# Patient Record
Sex: Female | Born: 1965 | Race: Asian | Hispanic: No
Health system: Southern US, Community
[De-identification: ages and names within clinical notes are randomized; demographics above are authoritative.]

---

## 2007-06-04 ENCOUNTER — Ambulatory Visit: Payer: Self-pay | Admitting: Internal Medicine

## 2007-06-04 ENCOUNTER — Ambulatory Visit (HOSPITAL_COMMUNITY): Admission: RE | Admit: 2007-06-04 | Discharge: 2007-06-04 | Payer: Self-pay | Admitting: Family Medicine

## 2007-06-19 ENCOUNTER — Ambulatory Visit: Payer: Self-pay | Admitting: *Deleted

## 2007-06-19 ENCOUNTER — Ambulatory Visit: Payer: Self-pay | Admitting: Internal Medicine

## 2007-06-19 LAB — CONVERTED CEMR LAB
ALT: 13 units/L (ref 0–35)
Alkaline Phosphatase: 50 units/L (ref 39–117)
Basophils Absolute: 0 10*3/uL (ref 0.0–0.1)
Creatinine, Ser: 0.64 mg/dL (ref 0.40–1.20)
Eosinophils Absolute: 0.2 10*3/uL (ref 0.0–0.7)
Eosinophils Relative: 4 % (ref 0–5)
HCT: 37 % (ref 36.0–46.0)
Hemoglobin: 11.9 g/dL — ABNORMAL LOW (ref 12.0–15.0)
MCHC: 32.2 g/dL (ref 30.0–36.0)
MCV: 91.6 fL (ref 78.0–100.0)
Monocytes Absolute: 0.3 10*3/uL (ref 0.2–0.7)
Platelets: 277 10*3/uL (ref 150–400)
RDW: 14 % (ref 11.5–14.0)
Sodium: 138 meq/L (ref 135–145)
TSH: 2.072 microintl units/mL (ref 0.350–5.50)
Total Bilirubin: 0.5 mg/dL (ref 0.3–1.2)
Total Protein: 7.4 g/dL (ref 6.0–8.3)

## 2008-04-06 IMAGING — CR DG FOOT COMPLETE 3+V*L*
3 series · 3 of 3 positions shown · non-contrast
Comparison: None available.

CLINICAL DATA: Injury.  Swelling fourth digit.  
 LEFT FOOT ? 3 VIEW:

[t foot ap left]
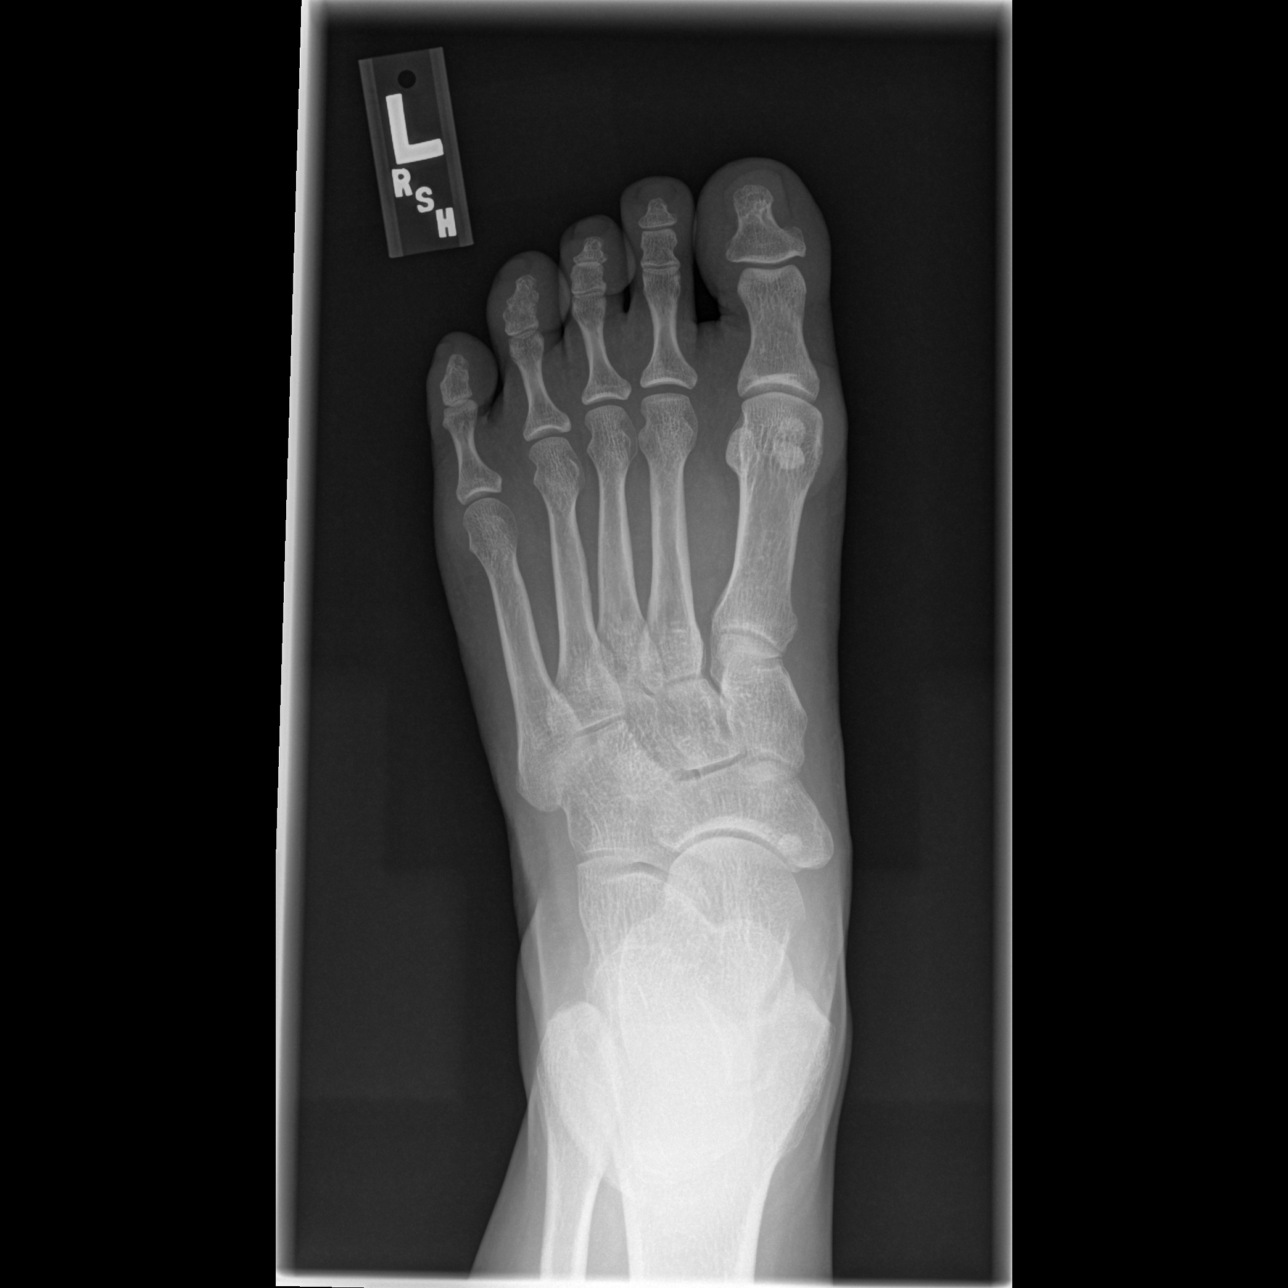

[t foot lat left]
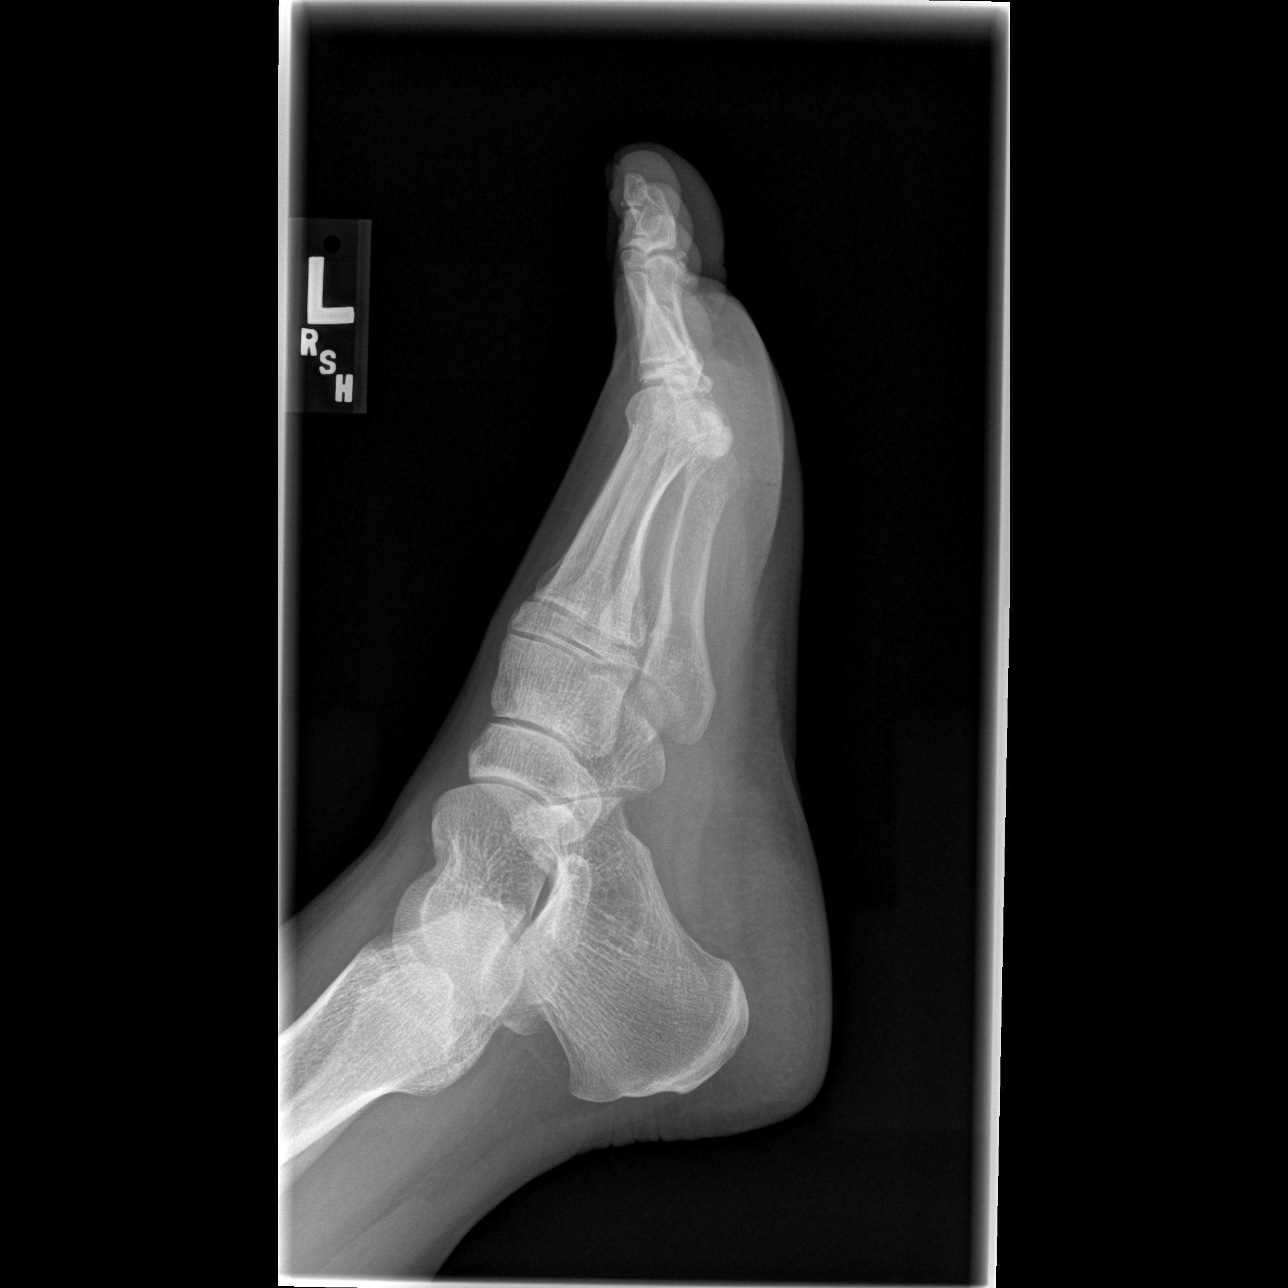

[t foot oblique left]
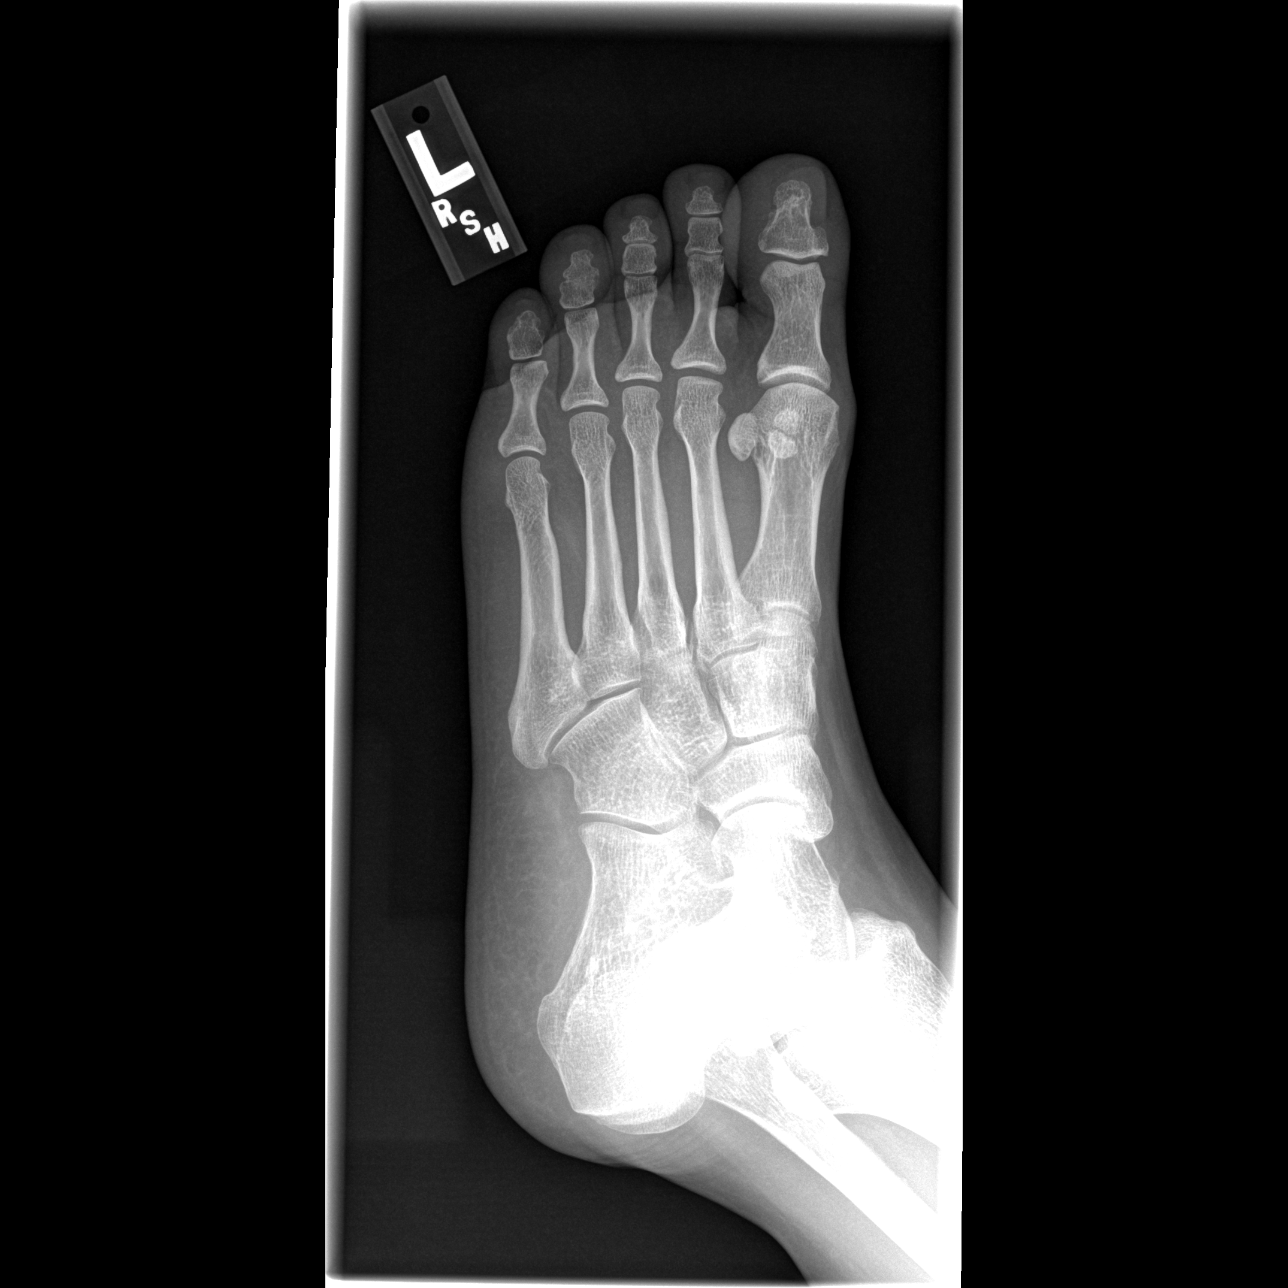

[3 of 3 positions shown; findings below may reference images not displayed]

FINDINGS: No fracture or dislocation.
IMPRESSION: No fracture or dislocation.

## 2018-05-26 ENCOUNTER — Encounter: Payer: Self-pay | Admitting: *Deleted

## 2018-05-28 NOTE — Congregational Nurse Program (Unsigned)
Congregational Nurse Program Note  Date of Encounter: 05/26/2018  Past Medical History: No past medical history on file.  Encounter Details: CNP Questionnaire - 05/26/18 1330      Questionnaire   Patient Status  Immigrant    Race  Asian    [facility]   Insurance  Not Applicable [place] [facility]   [place] [facility]   Uninsured  Uninsured (NEW 1x/quarter)    Food  No food insecurities    Housing/Utilities  No permanent housing    Transportation  No transportation needs    Interpersonal Safety  Yes, feel physically and emotionally safe where you currently live    Medication  No medication insecurities    Medical Provider  No    Referrals  Other non profit medical staff   non profit medical staff   ED Visit Averted  Not Applicable    Life-Saving Intervention Made  Not Applicable      visit office . 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 Visited her parent. Had been problems sob, passed out several weeks ago, ongoing investigation under Dr's care in [place], unidentified problem .  Consulted Dr. Selena BattenKim in our church, referral endocrine Dr. In [place] . Will f/p

## 2020-02-26 ENCOUNTER — Ambulatory Visit: Payer: Self-pay | Attending: Internal Medicine

## 2020-02-26 DIAGNOSIS — Z23 Encounter for immunization: Secondary | ICD-10-CM

## 2020-02-26 NOTE — Progress Notes (Signed)
° °  Covid-19 Vaccination Clinic  Name:  Shelly Garrison    MRN: [id number] DOB: 1966/08/12  02/26/2020  [name] was observed post Covid-19 immunization for 15 minutes without incident. She was provided with Vaccine Information Sheet and instruction to access the V-Safe system.   [name] was instructed to call 911 with any severe reactions post vaccine:  Difficulty breathing   Swelling of face and throat   A fast heartbeat   A bad rash all over body   Dizziness and weakness   Immunizations Administered    Name Date Dose VIS Date Route   Pfizer COVID-19 Vaccine 02/26/2020  8:31 AM 0.3 mL 12/17/2018 Intramuscular   Manufacturer: [employer], [employer]   Lot: [id number]   NDC: [address]-9

## 2020-03-23 ENCOUNTER — Ambulatory Visit: Payer: Self-pay | Attending: Internal Medicine

## 2020-03-23 DIAGNOSIS — Z23 Encounter for immunization: Secondary | ICD-10-CM

## 2020-03-23 NOTE — Progress Notes (Signed)
   Covid-19 Vaccination Clinic  Name:  Sharona Rovner    MRN: [id number] DOB: [birth year]  03/23/2020  Ms. Idler was observed post Covid-19 immunization for 15 minutes without incident. She was provided with Vaccine Information Sheet and instruction to access the V-Safe system.   Ms. Rhinehart was instructed to call 911 with any severe reactions post vaccine: Marland Kitchen Difficulty breathing  . Swelling of face and throat  . A fast heartbeat  . A bad rash all over body  . Dizziness and weakness   Immunizations Administered    Name Date Dose VIS Date Route   Pfizer COVID-19 Vaccine 03/23/2020  8:23 AM 0.3 mL 12/17/2018 Intramuscular   Manufacturer: [employer], [employer]   Lot: [id number]   NDC: [address]-4

## 2020-06-27 ENCOUNTER — Encounter: Payer: Self-pay | Admitting: *Deleted

## 2023-07-02 ENCOUNTER — Inpatient Hospital Stay: Payer: Self-pay

## 2023-07-02 ENCOUNTER — Inpatient Hospital Stay: Payer: Self-pay | Admitting: Genetic Counselor
